# Patient Record
Sex: Male | Born: 1990 | Race: White | Hispanic: No | Marital: Married | Smoking: Never smoker
Health system: Southern US, Community
[De-identification: ages and names within clinical notes are randomized; demographics above are authoritative.]

---

## 2018-10-30 ENCOUNTER — Other Ambulatory Visit (HOSPITAL_COMMUNITY): Payer: Self-pay | Admitting: Family Medicine

## 2018-10-30 DIAGNOSIS — R1011 Right upper quadrant pain: Secondary | ICD-10-CM

## 2018-10-30 DIAGNOSIS — R112 Nausea with vomiting, unspecified: Secondary | ICD-10-CM

## 2018-11-02 ENCOUNTER — Encounter (HOSPITAL_COMMUNITY)
Admission: RE | Admit: 2018-11-02 | Discharge: 2018-11-02 | Disposition: A | Discharge status: Home / Self Care | Payer: BC Managed Care – PPO | Source: Ambulatory Visit | Attending: Family Medicine | Admitting: Family Medicine

## 2018-11-02 ENCOUNTER — Other Ambulatory Visit: Payer: Self-pay

## 2018-11-02 ENCOUNTER — Encounter (HOSPITAL_COMMUNITY): Payer: Self-pay

## 2018-11-02 DIAGNOSIS — R1011 Right upper quadrant pain: Secondary | ICD-10-CM

## 2018-11-02 DIAGNOSIS — R112 Nausea with vomiting, unspecified: Secondary | ICD-10-CM | POA: Diagnosis present

## 2018-11-02 MED ORDER — TECHNETIUM TC 99M MEBROFENIN IV KIT
5.0000 | PACK | Freq: Once | INTRAVENOUS | Status: AC | PRN
  Administered 2018-11-02: 5.5 via INTRAVENOUS

## 2018-11-02 MED ORDER — STERILE WATER FOR INJECTION IJ SOLN
INTRAMUSCULAR | Status: AC
  Administered 2018-11-02: 3.18 mL
  Filled 2018-11-02: qty 10

## 2018-11-02 MED ORDER — SINCALIDE 5 MCG IJ SOLR
INTRAMUSCULAR | Status: AC
  Administered 2018-11-02: 11:00:00 3.18 ug
  Filled 2018-11-02: qty 5

## 2018-11-25 ENCOUNTER — Other Ambulatory Visit: Payer: Self-pay

## 2018-11-25 ENCOUNTER — Ambulatory Visit (INDEPENDENT_AMBULATORY_CARE_PROVIDER_SITE_OTHER): Payer: BC Managed Care – PPO | Admitting: Nurse Practitioner

## 2018-11-25 ENCOUNTER — Encounter (INDEPENDENT_AMBULATORY_CARE_PROVIDER_SITE_OTHER): Payer: Self-pay | Admitting: Nurse Practitioner

## 2018-11-25 VITALS — BP 124/82 | HR 86 | Temp 98.1°F | Ht 72.0 in | Wt 389.4 lb

## 2018-11-25 DIAGNOSIS — R1011 Right upper quadrant pain: Secondary | ICD-10-CM

## 2018-11-25 DIAGNOSIS — K760 Fatty (change of) liver, not elsewhere classified: Secondary | ICD-10-CM | POA: Diagnosis not present

## 2018-11-25 DIAGNOSIS — R945 Abnormal results of liver function studies: Secondary | ICD-10-CM

## 2018-11-25 DIAGNOSIS — I10 Essential (primary) hypertension: Secondary | ICD-10-CM | POA: Insufficient documentation

## 2018-11-25 DIAGNOSIS — R7989 Other specified abnormal findings of blood chemistry: Secondary | ICD-10-CM | POA: Insufficient documentation

## 2018-11-25 MED ORDER — OMEPRAZOLE 20 MG PO CPDR: 20.0000 mg | DELAYED_RELEASE_CAPSULE | Freq: Every day | ORAL | 0 refills | Status: DC

## 2018-11-25 NOTE — Progress Notes (Addendum)
I called Dr. Adelina Mings to discuss patient's condition this morning. Since patient is morbidly obese and very high risk for cholecystectomy she feels EGD should be performed to rule out peptic ulcer disease and possibly an MRCP. Will arrange for MRCP and if negative for choledocholithiasis or bile duct stricture we will proceed with EGD.Derek Greene

## 2018-11-25 NOTE — Progress Notes (Addendum)
Subjective:    Patient ID: Derek Greene, male    DOB: [birth year], 28 y.o.   MRN: [id number]  Patient's records reviewed including records from [facility]. I agree with Ms. Berniece Pap NP's assessment that Derek Greene's symptoms are compelling for gallbladder disease in spite of negative studies.  His transaminases are mildly elevated which is most likely due to fatty liver.  I doubt that we are dealing with choledocholithiasis.  Given his unrelenting symptoms one would expect progressive rise in his transaminases.  Given his young age biochemical studies have been ordered to rule out other conditions which can result in elevated transaminases. No history of NSAID use.  I do not believe that EGD would necessarily help as index of suspicion that he has peptic ulcer disease is very low.  Similarly I do not believe MRCP is warranted. I would agree to proceed with cholecystectomy along with a liver biopsy.  If Dr. Rocco Serene strongly feels that EGD should be performed prior to cholecystectomy I will be glad to arrange an expedited.   Mr. Derek Greene is a 28 year old male with a past medical history of obesity, GERD and hypertension. Past surgical history includes hernia repair, tonsillectomy and wisdom teeth extraction. He complains of having RUQ pain that started 6 months ago. The episodes of RUQ pain were infrequent and lasted for 1 or 2 hours, he basically ignored this pain. However, about 1 1/2 months ago the  RUQ pain was more frequent and lasted longer. He describes having RUQ pain described as a sharp stabbing pain that lasts for 6 to 9 hours. The pain radiates to his shoulder blades at night time only when he turns on his right side. No nausea or vomiting. He typically passes a normal bowel movement daily, no rectal bleeding or black stools. He saw a white stool on one occasional three weeks ago. No change in urine color, voids clear yellow urine. He had severe RUQ pain and presented to [facility] 10/29/2018. An abdominal ultrasound showed a normal gallbladder, no gallstones, the common bile duct measured at 5.71m and a fatty liver was noted. CBC with WBC 7.7. Hg 14.5. HCT 42.9. Alk Phos 57. AST 46.4. ALT 71. T. Bili 0.4. Lipase 17. He was seen by his PCP on 10/30/2018. A HIDA scan 11/02/2018 showed a gallbladder EF 87% without evidence of acute cholecystitis. However, his RUQ abdominal pain was reproduced after the administration of CCK.  An acute hepatitis panel was done, I do not have these results. He was referred to general surgeon, Dr. MLindalou Hose who recommend a GI consult prior to pursuing an elective cholecystectomy.   Currently, he continue to have nausea and RUQ pain. His appetite has decreased. No weight loss. No fever, sweats or chills. He is passing a normal solid stool most days, one white stool 3 weeks ago as mentioned above. History of GERD. He denies having any heartburn, dysphagia or LUQ pain. No alcohol use. No NSAID use.   Medications: Bystolic 168TMonce daily  NKDA  Family HX: no family history of upper or lower GI cancer. No family history of liver disease  Review of Systems  See HPI, all other systems reviewed and are negative    Objective:   Physical Exam   Blood pressure 124/82, pulse 86, temperature 98.1 F (36.7 C), height 6' (1.829 m), weight (!) 389 lb 6.4 oz (176.6 kg). BMI 52%  General: pleasant 28year old obese male in NAD Eyes: sclera nonicteric,  conjunctiva pink Mouth: dentition intact Neck: supple, no thyromegaly or lymphadenopathy Heart: RRR, no murmur Lungs: clear throughout Abdomen: obese abdomen, soft, mild RUQ tenderness without rebound or guarding. Rectal: deferred Extremities: no edema Neuro: alert and oriented x 4, no focal deficits    Assessment & Plan:   1. 28 y.o. obese male with RUQ, possible chronic cholecystitis, no evidence of gallstones or biliary obstruction/choledocholithiasis, one episode of possible acholic stool   -agree with elective cholecystectomy  -if LFTs remain elevated, would request liver biopsy at time of cholecystectomy -patient to present to the ED if he develops fever, severe abdominal pain or jaundice -Omeprazole 40m one capsule by mouth once daily  -I will consult with Dr. RLaural Goldento review my recommendations and to determine if patient should have EGD prior to cholecystectomy   2. Elevated LFTs -request copy of acute hepatitis panel done by PCP's office  -ANA, SMA, AMA, ceruloplasmin, IgG, Iron, Ferritin, A1AT, Hepatic panel and Celiac panel  3. Fatty liver  -discussed with patient the significance of having a fatty liver, at risk for NAFLD/NASH and cirrhosis -discussed weight loss, consider bariatric surgery ie: gastric sleeve   4. HTN

## 2018-11-25 NOTE — Patient Instructions (Signed)
1.  Complete the provided lab order today  2. I will request a copy of your Hepatitis A, B an C tests done by her primary doctor  3. Avoid fatty foods, do not eat within 3 hours of going to bed  4. Trial with Omeprazole 20mg  one capsule by mouth once daily   5. Weight loss encouraged. We discussed the significance of having a fatty liver, you are at increased risk for fatty liver disease which could in time result in cirrhosis, scarring of the liver.  6. Call our office if your right upper abdominal pain worsens. Go to the local emergency room if severe abdominal or upper back pain occurs  7.  Follow up in our office in 2 to 3 weeks   8. Further recommendation to be determined after you complete the ordered lab tests.

## 2018-11-27 ENCOUNTER — Telehealth (INDEPENDENT_AMBULATORY_CARE_PROVIDER_SITE_OTHER): Payer: Self-pay | Admitting: Nurse Practitioner

## 2018-11-27 NOTE — Telephone Encounter (Signed)
Derek Greene, I have not yet received requested hepatitis panel from PCP. Please contact PCP's office for copy of acute hepatitis panel that was reported as done, if not found, I will need to order Hep A, B and C serologies. THX

## 2018-11-30 LAB — HEPATIC FUNCTION PANEL
AG Ratio: 1.8 (calc) (ref 1.0–2.5)
ALT: 50 U/L — ABNORMAL HIGH (ref 9–46)
AST: 28 U/L (ref 10–40)
Albumin: 4 g/dL (ref 3.6–5.1)
Alkaline phosphatase (APISO): 52 U/L (ref 36–130)
Bilirubin, Direct: 0.1 mg/dL (ref 0.0–0.2)
Globulin: 2.2 g/dL (calc) (ref 1.9–3.7)
Indirect Bilirubin: 0.3 mg/dL (calc) (ref 0.2–1.2)
Total Bilirubin: 0.4 mg/dL (ref 0.2–1.2)
Total Protein: 6.2 g/dL (ref 6.1–8.1)

## 2018-11-30 LAB — CELIAC DISEASE PANEL
(tTG) Ab, IgA: 1 U/mL
(tTG) Ab, IgG: 2 U/mL
Gliadin IgA: 9 Units
Gliadin IgG: 2 Units
Immunoglobulin A: 128 mg/dL (ref 47–310)

## 2018-11-30 LAB — MITOCHONDRIAL ANTIBODIES: Mitochondrial M2 Ab, IgG: <=20 U

## 2018-11-30 LAB — ANA: ANA Titer 1: NEGATIVE

## 2018-11-30 LAB — FERRITIN: Ferritin: 221 ng/mL (ref 38–380)

## 2018-11-30 LAB — CERULOPLASMIN: Ceruloplasmin: 29 mg/dL (ref 18–36)

## 2018-11-30 LAB — ANTI-SMOOTH MUSCLE ANTIBODY, IGG: Actin (Smooth Muscle) Antibody (IGG): <20 U (ref <20)

## 2018-11-30 LAB — IRON: Iron: 105 ug/dL (ref 50–195)

## 2018-11-30 LAB — ALPHA-1-ANTITRYPSIN: A-1 Antitrypsin, Ser: 160 mg/dL (ref 83–199)

## 2018-11-30 LAB — IGG: IgG (Immunoglobin G), Serum: 798 mg/dL (ref 600–1640)

## 2018-12-01 ENCOUNTER — Other Ambulatory Visit (INDEPENDENT_AMBULATORY_CARE_PROVIDER_SITE_OTHER): Payer: Self-pay | Admitting: *Deleted

## 2018-12-01 DIAGNOSIS — R1013 Epigastric pain: Secondary | ICD-10-CM

## 2018-12-01 DIAGNOSIS — R7401 Elevation of levels of liver transaminase levels: Secondary | ICD-10-CM

## 2018-12-01 DIAGNOSIS — R1011 Right upper quadrant pain: Secondary | ICD-10-CM

## 2018-12-12 ENCOUNTER — Other Ambulatory Visit: Payer: Self-pay

## 2018-12-12 ENCOUNTER — Ambulatory Visit (HOSPITAL_COMMUNITY)
Admission: RE | Admit: 2018-12-12 | Discharge: 2018-12-12 | Disposition: A | Discharge status: Home / Self Care | Payer: BC Managed Care – PPO | Source: Ambulatory Visit | Attending: Internal Medicine | Admitting: Internal Medicine

## 2018-12-12 DIAGNOSIS — R1013 Epigastric pain: Secondary | ICD-10-CM | POA: Diagnosis not present

## 2018-12-12 DIAGNOSIS — R1011 Right upper quadrant pain: Secondary | ICD-10-CM | POA: Diagnosis present

## 2018-12-12 DIAGNOSIS — R740 Nonspecific elevation of levels of transaminase and lactic acid dehydrogenase [LDH]: Secondary | ICD-10-CM | POA: Diagnosis present

## 2018-12-12 DIAGNOSIS — R7401 Elevation of levels of liver transaminase levels: Secondary | ICD-10-CM

## 2018-12-12 MED ORDER — GADOBUTROL 1 MMOL/ML IV SOLN
10.0000 mL | Freq: Once | INTRAVENOUS | Status: AC | PRN
  Administered 2018-12-12: 15:00:00 10 mL via INTRAVENOUS

## 2018-12-16 ENCOUNTER — Encounter (INDEPENDENT_AMBULATORY_CARE_PROVIDER_SITE_OTHER): Payer: Self-pay | Admitting: Nurse Practitioner

## 2018-12-16 ENCOUNTER — Other Ambulatory Visit: Payer: Self-pay

## 2018-12-16 ENCOUNTER — Other Ambulatory Visit (INDEPENDENT_AMBULATORY_CARE_PROVIDER_SITE_OTHER): Payer: Self-pay | Admitting: *Deleted

## 2018-12-16 ENCOUNTER — Encounter (INDEPENDENT_AMBULATORY_CARE_PROVIDER_SITE_OTHER): Payer: Self-pay | Admitting: *Deleted

## 2018-12-16 ENCOUNTER — Ambulatory Visit (INDEPENDENT_AMBULATORY_CARE_PROVIDER_SITE_OTHER): Payer: BC Managed Care – PPO | Admitting: Nurse Practitioner

## 2018-12-16 VITALS — BP 131/85 | HR 96 | Temp 98.4°F | Ht 72.0 in | Wt 388.7 lb

## 2018-12-16 DIAGNOSIS — R945 Abnormal results of liver function studies: Secondary | ICD-10-CM | POA: Diagnosis not present

## 2018-12-16 DIAGNOSIS — R1013 Epigastric pain: Secondary | ICD-10-CM

## 2018-12-16 DIAGNOSIS — K760 Fatty (change of) liver, not elsewhere classified: Secondary | ICD-10-CM

## 2018-12-16 DIAGNOSIS — R1011 Right upper quadrant pain: Secondary | ICD-10-CM | POA: Insufficient documentation

## 2018-12-16 DIAGNOSIS — R7989 Other specified abnormal findings of blood chemistry: Secondary | ICD-10-CM

## 2018-12-16 NOTE — Progress Notes (Signed)
   Subjective:    Patient ID: Derek Greene, male    DOB: Nov 29, 1990, 29 y.o.   MRN: [id number]  HPI Mr. Derek Greene is a 28 year old male with a past medical history of obesity, GERD and hypertension. Past surgical history includes hernia repair, tonsillectomy and wisdom teeth extraction.  He was seen in the office 11/25/2018 for further evaluation regarding right upper quadrant abdominal pain, fatty liver and elevated LFTs.  He was also evaluated by surgeon Dr. Adelina Mings  for possible cholecystectomy. Plan was for the patient to proceed with an MRCP and EGD prior to scheduling  a cholecystectomy. The MRCP 12/12/2018 did not show any evidence of choledocholithiasis. He is scheduled for an upper endoscopy with Dr. Melony Overly 12/30/2018.  He remains on omeprazole 20 mg once daily.  Overall, he feels much better.  He continues to have a low level of right upper quadrant discomfort that is always in the background.  No severe episodes of abdominal pain.  No nausea or vomiting.  If he eats a fatty food his right upper quadrant pain is more noticeable.  He is passing normal normal bowel movements daily.  No rectal bleeding or melena. His wife is expecting their first baby due 06/2019.   Labs 11/26/2018: AST 28.  ALT 50.  Total bili 0.4.  Iron 105.  Ferritin 221.  Alk phos 52.  Ceruloplasmin 29.  IgG 798.  Alpha-1 antitrypsin 160.  Celiac panel was negative.  Smooth muscle antibody < 20.  Antimitochondrial antibody < 20.  Abdominal MRI with MRCP W/WO contrast 12/12/2018:  No acute findings.  No evidence of choledocholithiasis.  Diffuse hepatic steatosis.     Objective:   Physical Exam BP 131/85   Pulse 96   Temp 98.4 F (36.9 C) (Oral)   Ht 6' (1.829 m)   Wt (!) 388 lb 11.2 oz (176.3 kg)   BMI 52.72 kg/m   General: 28 year old obese white male no acute distress Heart: Regular rate and rhythm, no murmurs Lungs: Clear throughout Abdomen: Mild epigastric tenderness without rebound or guarding, soft,  nondistended, positive bowel sounds to all 4 quadrants, no HSM appreciated but difficult exam in obese patient Extremities: No edema Neuro: Alert and oriented x4, no focal deficits     Assessment & Plan:   1.  Right upper quadrant pain, stable -Proceed with EGD as scheduled -Continue omeprazole 20 mg once daily, may increase to twice daily if needed -Follow-up with Dr. Rocco Serene after EGD completed -Patient will call our office if his abdominal pain worsens, repeat laboratory studies would be done at that time -Weight loss encouraged, avoid fatty foods, exercise as tolerated -Further follow up to be determined after EGD completed   2.  Elevated LFTs -will repeat LFTs in 4 weeks   3.  Fatty liver --Weight loss encouraged, avoid fatty foods, exercise as tolerated

## 2018-12-16 NOTE — Patient Instructions (Signed)
1. Proceed with your upper endoscopy (EGD) as scheduled  2. Continue Omeprazole 20mg  once daily may increase to twice daily if your upper abdominal pain increases  3. Healthy diet. Avoid fatty foods. Exercise. Lose weight.   4. Call our office if your abdominal pain worsens  5. Further follow up to be determined after EGD completed

## 2018-12-28 ENCOUNTER — Encounter (HOSPITAL_COMMUNITY)
Admission: RE | Admit: 2018-12-28 | Discharge: 2018-12-28 | Disposition: A | Discharge status: Home / Self Care | Payer: BC Managed Care – PPO | Source: Ambulatory Visit | Attending: Internal Medicine | Admitting: Internal Medicine

## 2018-12-28 ENCOUNTER — Encounter (HOSPITAL_COMMUNITY): Payer: Self-pay | Admitting: Anesthesiology

## 2018-12-28 ENCOUNTER — Other Ambulatory Visit (HOSPITAL_COMMUNITY)
Admission: RE | Admit: 2018-12-28 | Discharge: 2018-12-28 | Disposition: A | Discharge status: Home / Self Care | Payer: BC Managed Care – PPO | Source: Ambulatory Visit | Attending: Internal Medicine | Admitting: Internal Medicine

## 2018-12-28 DIAGNOSIS — Z20828 Contact with and (suspected) exposure to other viral communicable diseases: Secondary | ICD-10-CM | POA: Diagnosis not present

## 2018-12-28 DIAGNOSIS — Z01812 Encounter for preprocedural laboratory examination: Secondary | ICD-10-CM | POA: Diagnosis present

## 2018-12-28 LAB — SARS CORONAVIRUS 2 (TAT 6-24 HRS): SARS Coronavirus 2: NEGATIVE

## 2018-12-29 ENCOUNTER — Telehealth (INDEPENDENT_AMBULATORY_CARE_PROVIDER_SITE_OTHER): Payer: Self-pay | Admitting: *Deleted

## 2018-12-29 ENCOUNTER — Other Ambulatory Visit (INDEPENDENT_AMBULATORY_CARE_PROVIDER_SITE_OTHER): Payer: Self-pay | Admitting: Nurse Practitioner

## 2018-12-29 ENCOUNTER — Encounter (INDEPENDENT_AMBULATORY_CARE_PROVIDER_SITE_OTHER): Payer: Self-pay | Admitting: *Deleted

## 2018-12-29 DIAGNOSIS — R1011 Right upper quadrant pain: Secondary | ICD-10-CM

## 2018-12-29 NOTE — Telephone Encounter (Signed)
Orders re-entered

## 2018-12-29 NOTE — Telephone Encounter (Signed)
EGD sch'd 01/29/19, patient aware, instructions mailed

## 2018-12-29 NOTE — Telephone Encounter (Signed)
Patient has decided to proceed with EGD even though insurance will not cover it - can you put order back in for me, please. thanks

## 2018-12-30 ENCOUNTER — Encounter (HOSPITAL_COMMUNITY): Admission: RE | Payer: Self-pay | Source: Home / Self Care

## 2018-12-30 ENCOUNTER — Ambulatory Visit (HOSPITAL_COMMUNITY)
Admission: RE | Admit: 2018-12-30 | Payer: BC Managed Care – PPO | Source: Home / Self Care | Admitting: Internal Medicine

## 2018-12-30 SURGERY — ESOPHAGOGASTRODUODENOSCOPY (EGD) WITH PROPOFOL
Anesthesia: Monitor Anesthesia Care

## 2019-01-27 ENCOUNTER — Other Ambulatory Visit (HOSPITAL_COMMUNITY)
Admission: RE | Admit: 2019-01-27 | Discharge: 2019-01-27 | Disposition: A | Discharge status: Home / Self Care | Payer: BC Managed Care – PPO | Source: Ambulatory Visit | Attending: Internal Medicine | Admitting: Internal Medicine

## 2019-01-27 ENCOUNTER — Encounter (HOSPITAL_COMMUNITY): Admission: RE | Admit: 2019-01-27 | Payer: BC Managed Care – PPO | Source: Ambulatory Visit

## 2019-01-29 ENCOUNTER — Encounter (HOSPITAL_COMMUNITY): Admission: RE | Payer: Self-pay | Source: Home / Self Care

## 2019-01-29 ENCOUNTER — Ambulatory Visit (HOSPITAL_COMMUNITY)
Admission: RE | Admit: 2019-01-29 | Payer: BC Managed Care – PPO | Source: Home / Self Care | Admitting: Internal Medicine

## 2019-01-29 SURGERY — ESOPHAGOGASTRODUODENOSCOPY (EGD) WITH PROPOFOL
Anesthesia: Monitor Anesthesia Care

## 2020-07-26 IMAGING — NM NUCLEAR MEDICINE HEPATOBILIARY IMAGING WITH GALLBLADDER EF
2 series · 12 of 12 positions shown · non-contrast
Comparison: None

CLINICAL DATA: Nausea and abdominal pain for 2 months

EXAM:
NUCLEAR MEDICINE HEPATOBILIARY IMAGING WITH GALLBLADDER EF
TECHNIQUE: Sequential images of the abdomen were obtained [DATE] minutes
following intravenous administration of radiopharmaceutical. After
slow intravenous infusion of 3.18 micrograms Cholecystokinin,
gallbladder ejection fraction was determined.
RADIOPHARMACEUTICALS:  5.5 mCi Wc-99m Choletec IV

[Series 1: biliary · 3.25mm/px · 6 of 58 frames shown]
[frame 5/58]
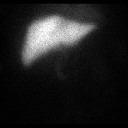
[frame 15/58]
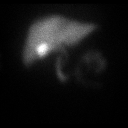
[frame 25/58]
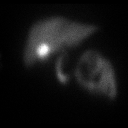
[frame 34/58]
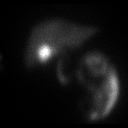
[frame 44/58]
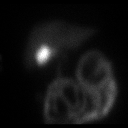
[frame 54/58]
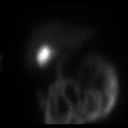

[Series 2: gbef · 3.25mm/px · 6 of 60 frames shown]
[frame 6/60]
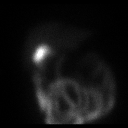
[frame 16/60]
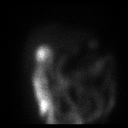
[frame 26/60]
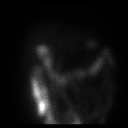
[frame 36/60]
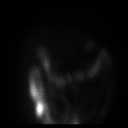
[frame 46/60]
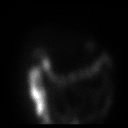
[frame 56/60]
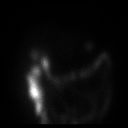

[12 of 12 positions shown; findings below may reference images not displayed]

FINDINGS: Normal tracer extraction from bloodstream indicating normal
hepatocellular function.

Normal excretion of tracer into biliary tree.

Gallbladder visualized at 11 min.

Small bowel visualized at 8 min.

No hepatic retention of tracer.

Subjectively normal emptying of tracer from gallbladder following
CCK administration.

Calculated gallbladder ejection fraction is 87%, normal.

Patient reported abdominal pain following CCK administration.

Normal gallbladder ejection fraction following CCK stimulation is
greater than 40% at 1 hour.
IMPRESSION: Patent biliary tree.

Normal gallbladder ejection fraction of 87% following CCK
stimulation.

Patient reported abdominal pain following CCK.

## 2020-09-04 IMAGING — MR MR ABDOMEN WO/W CM MRCP
18 of 23 series · 42 of 48 positions shown · IV contrast (Contrast agent)
Comparison: Ultrasound on 10/29/2018

CLINICAL DATA: Epigastric and right upper quadrant pain. Elevated
transaminase level.

EXAM:
MRI ABDOMEN WITHOUT AND WITH CONTRAST (INCLUDING MRCP)
TECHNIQUE: Multiplanar multisequence MR imaging of the abdomen was performed
both before and after the administration of intravenous contrast.
Heavily T2-weighted images of the biliary and pancreatic ducts were
obtained, and three-dimensional MRCP images were rendered by post
processing.
CONTRAST:  10 mL Gadavist

[Series 4: ax haste · axial · 6.0mm · 1.56mm/px · 1 of 46 slices shown]
[im 1/46]
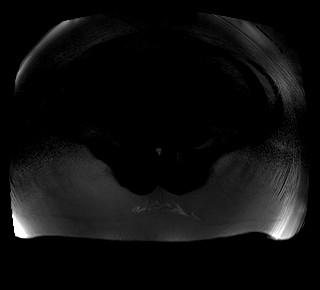

[Series 5: bSSFP · coronal · 6.0mm · 0.98mm/px · 1 of 42 slices shown]
[im 1/42]
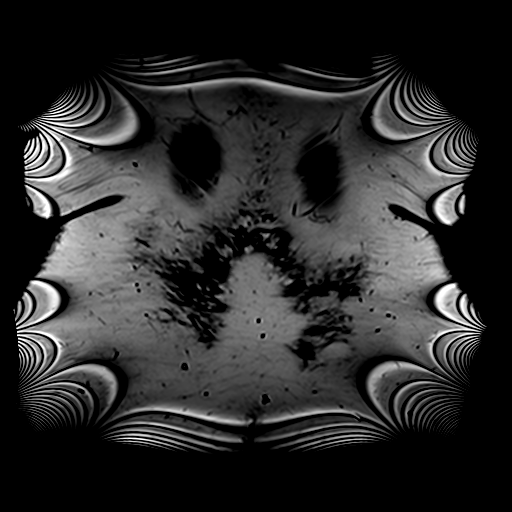

[Series 6: T2 fat-sat · axial · 6.0mm · 1.56mm/px · 1 of 46 slices shown]
[im 1/46]
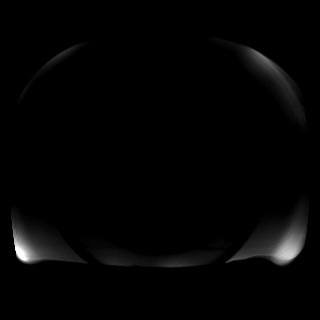

[Series 7: DWI · axial · 6.0mm · 1.87mm/px · z∈[-194,+87]mm · 3 of 120 slices shown (1 of 2)]
[im 1/120]
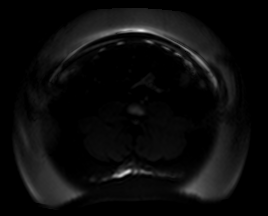
[im 60/120]
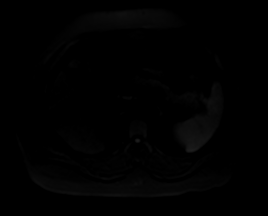
[im 120/120]
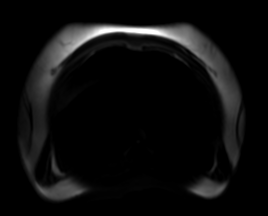

[Series 8: DWI · axial · 6.0mm · 1.87mm/px · 1 of 40 slices shown (2 of 2)]
[im 1/40]
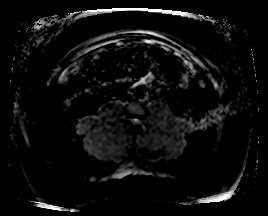

[Series 11: ax in and · axial · 3.0mm · 1.56mm/px · z∈[-162,+99]mm · 4 of 176 slices shown]
[im 1/176]
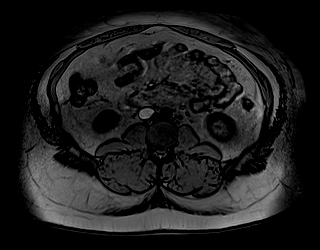
[im 59/176]
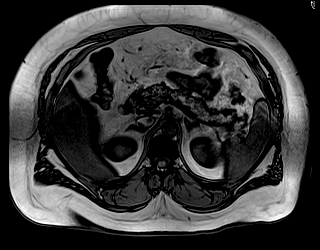
[im 117/176]
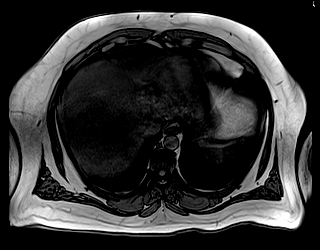
[im 176/176]
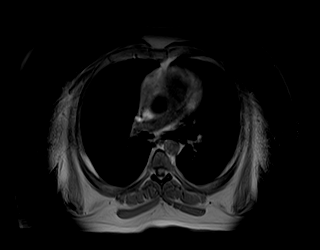

[Series 12: MRCP · coronal · 4.0mm · 1.56mm/px · 1 of 15 slices shown]
[im 1/15]
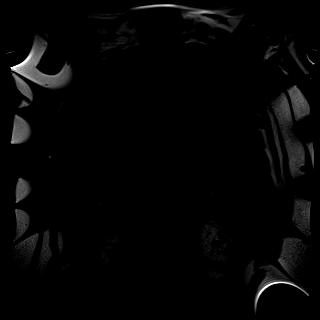

[Series 13: radials · coronal · 50.0mm · 0.78mm/px · 1 of 5 slices shown]
[im 1/5]
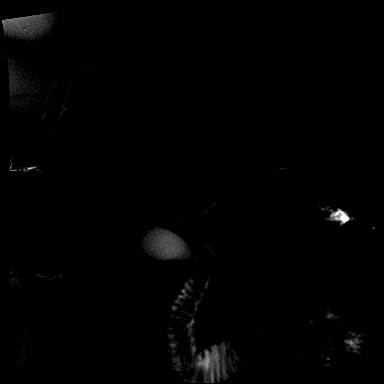

[Series 14: T1 dynamic · axial · non-contrast · 3.0mm · 1.56mm/px · z∈[-187,+98]mm · 2 of 96 slices shown (1 of 5)]
[im 1/96]
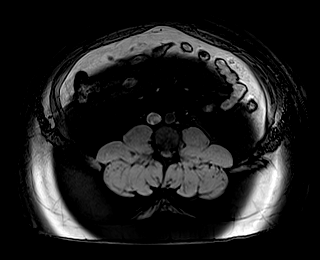
[im 96/96]
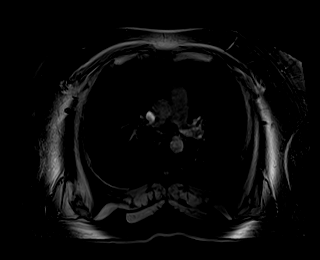

[Series 16: T1 dynamic post-contrast · axial · 3.0mm · 1.56mm/px · z∈[-187,+98]mm · 3 of 96 slices shown (1 of 5)]
[im 1/96]
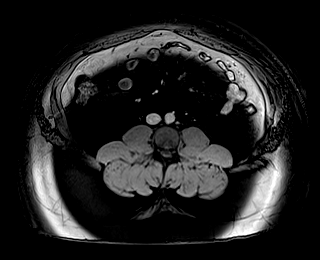
[im 48/96]
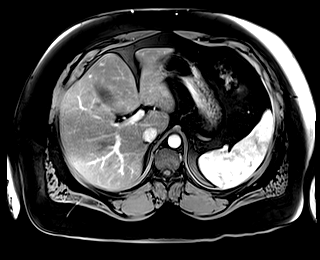
[im 96/96]
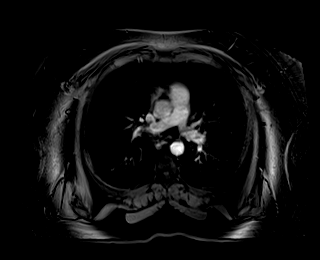

[Series 17: T1 dynamic · axial · 3.0mm · 1.56mm/px · z∈[-187,+98]mm · 3 of 96 slices shown (2 of 5)]
[im 1/96]
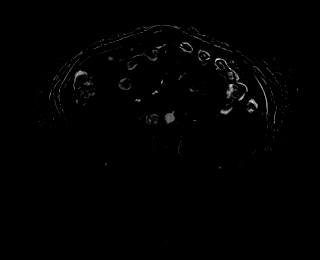
[im 48/96]
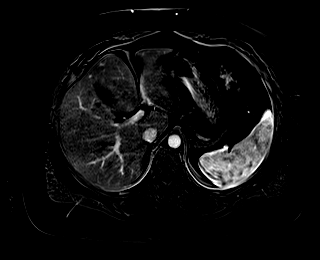
[im 96/96]
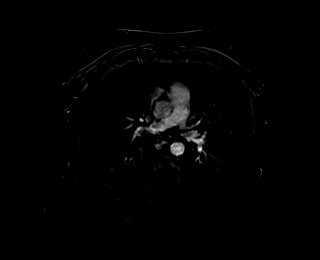

[Series 18: T1 dynamic post-contrast · axial · 3.0mm · 1.56mm/px · z∈[-187,+98]mm · 3 of 96 slices shown (2 of 5)]
[im 1/96]
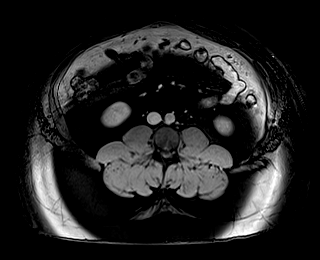
[im 48/96]
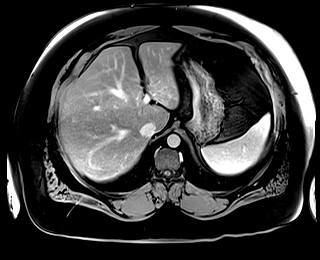
[im 96/96]
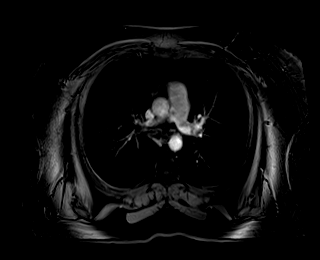

[Series 19: T1 dynamic · axial · 3.0mm · 1.56mm/px · z∈[-187,+98]mm · 3 of 96 slices shown (3 of 5)]
[im 1/96]
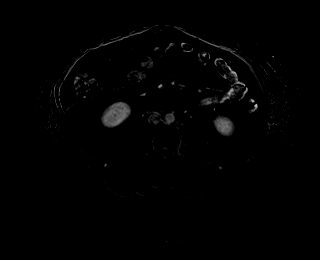
[im 48/96]
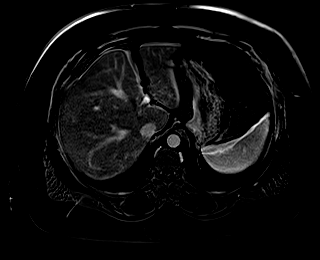
[im 96/96]
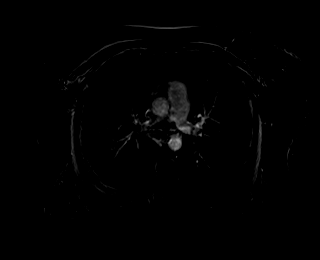

[Series 20: T1 dynamic post-contrast · axial · 3.0mm · 1.56mm/px · z∈[-187,+98]mm · 3 of 96 slices shown (3 of 5)]
[im 1/96]
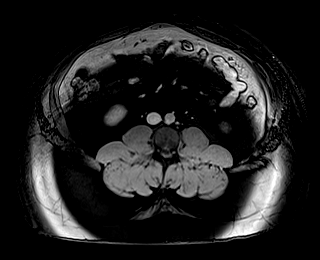
[im 48/96]
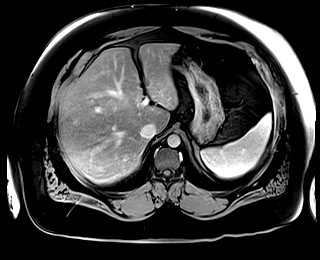
[im 96/96]
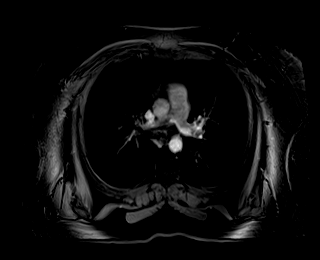

[Series 21: T1 dynamic · axial · 3.0mm · 1.56mm/px · z∈[-187,+98]mm · 3 of 96 slices shown (4 of 5)]
[im 1/96]
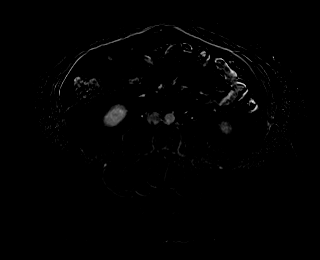
[im 48/96]
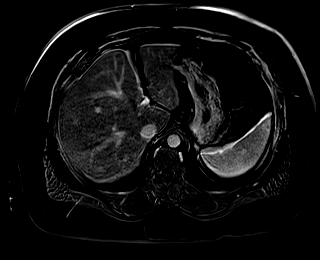
[im 96/96]
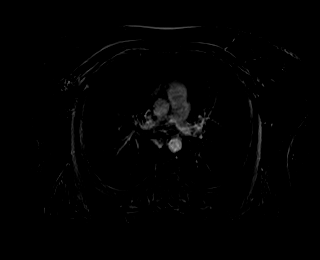

[Series 22: T1 dynamic post-contrast · axial · 3.0mm · 1.56mm/px · z∈[-187,+98]mm · 3 of 96 slices shown (4 of 5)]
[im 1/96]
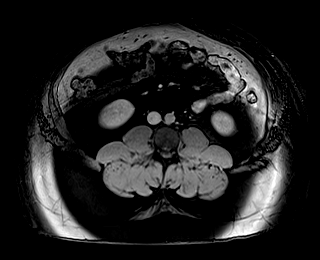
[im 48/96]
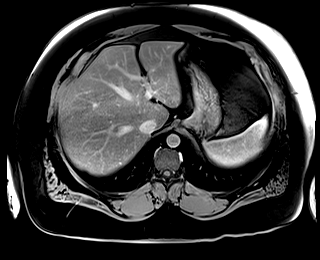
[im 96/96]
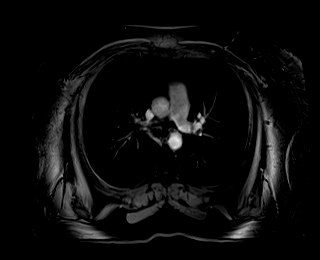

[Series 23: T1 dynamic · axial · 3.0mm · 1.56mm/px · z∈[-187,+98]mm · 3 of 96 slices shown (5 of 5)]
[im 1/96]
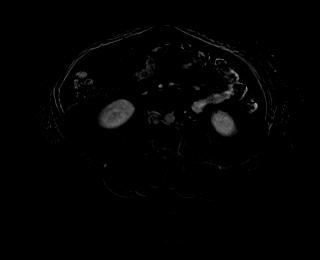
[im 48/96]
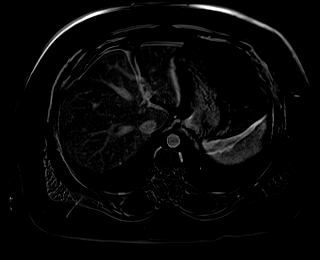
[im 96/96]
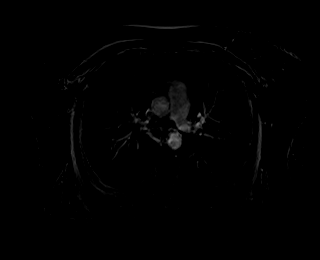

[Series 24: T1 dynamic post-contrast · coronal · 3.0mm · 1.56mm/px · 3 of 88 slices shown (5 of 5)]
[im 1/88]
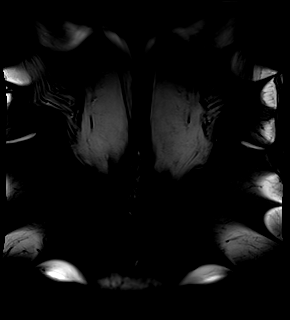
[im 44/88]
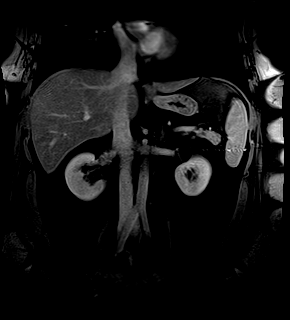
[im 88/88]
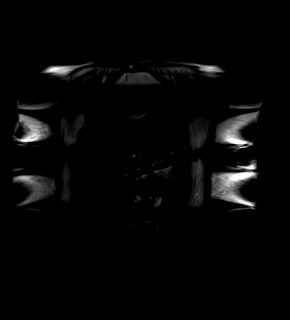

[42 of 48 positions shown; findings below may reference images not displayed]

FINDINGS: Lower chest: No acute findings.

Hepatobiliary: No hepatic masses identified. Diffuse hepatic
steatosis noted on chemical shift imaging Gallbladder is
unremarkable. Common bile duct measures 3 mm, within normal limits.
No evidence of choledocholithiasis.

Pancreas: No mass or inflammatory changes. No evidence of pancreatic
ductal dilatation.

Spleen:  Within normal limits in size and appearance.

Adrenals/Urinary Tract: No masses identified. No evidence of
hydronephrosis.

Stomach/Bowel: Visualized portion unremarkable.

Vascular/Lymphatic: No pathologically enlarged lymph nodes
identified. No abdominal aortic aneurysm.

Other:  None.

Musculoskeletal:  No suspicious bone lesions identified.
IMPRESSION: No acute findings.

Diffuse hepatic steatosis.
